# Patient Record
Sex: Male | Born: 1950 | Race: White | Hispanic: Yes | Marital: Married | Smoking: Never smoker
Health system: Southern US, Community
[De-identification: ages and names within clinical notes are randomized; demographics above are authoritative.]

## PROBLEM LIST (undated history)

## (undated) DIAGNOSIS — E7800 Pure hypercholesterolemia, unspecified: Secondary | ICD-10-CM

## (undated) DIAGNOSIS — I10 Essential (primary) hypertension: Secondary | ICD-10-CM

## (undated) HISTORY — PX: NASAL SEPTUM SURGERY: SHX37

## (undated) HISTORY — PX: KNEE SURGERY: SHX244

## (undated) HISTORY — PX: TONSILLECTOMY: SUR1361

---

## 2016-08-06 ENCOUNTER — Encounter (HOSPITAL_BASED_OUTPATIENT_CLINIC_OR_DEPARTMENT_OTHER): Payer: Self-pay | Admitting: *Deleted

## 2016-08-06 ENCOUNTER — Emergency Department (HOSPITAL_BASED_OUTPATIENT_CLINIC_OR_DEPARTMENT_OTHER): Payer: Medicare HMO

## 2016-08-06 ENCOUNTER — Observation Stay (HOSPITAL_BASED_OUTPATIENT_CLINIC_OR_DEPARTMENT_OTHER)
Admission: EM | Admit: 2016-08-06 | Discharge: 2016-08-07 | Disposition: A | Discharge status: Home / Self Care | Payer: Medicare HMO | Attending: Internal Medicine | Admitting: Internal Medicine

## 2016-08-06 DIAGNOSIS — R5383 Other fatigue: Secondary | ICD-10-CM | POA: Diagnosis not present

## 2016-08-06 DIAGNOSIS — M542 Cervicalgia: Secondary | ICD-10-CM

## 2016-08-06 DIAGNOSIS — R0789 Other chest pain: Secondary | ICD-10-CM | POA: Diagnosis present

## 2016-08-06 DIAGNOSIS — K219 Gastro-esophageal reflux disease without esophagitis: Secondary | ICD-10-CM | POA: Diagnosis present

## 2016-08-06 DIAGNOSIS — I10 Essential (primary) hypertension: Secondary | ICD-10-CM | POA: Diagnosis not present

## 2016-08-06 DIAGNOSIS — E785 Hyperlipidemia, unspecified: Secondary | ICD-10-CM | POA: Diagnosis not present

## 2016-08-06 DIAGNOSIS — I4510 Unspecified right bundle-branch block: Secondary | ICD-10-CM | POA: Insufficient documentation

## 2016-08-06 DIAGNOSIS — G8929 Other chronic pain: Secondary | ICD-10-CM | POA: Diagnosis present

## 2016-08-06 DIAGNOSIS — R079 Chest pain, unspecified: Secondary | ICD-10-CM | POA: Diagnosis not present

## 2016-08-06 HISTORY — DX: Pure hypercholesterolemia, unspecified: E78.00

## 2016-08-06 HISTORY — DX: Essential (primary) hypertension: I10

## 2016-08-06 LAB — CBC
HEMATOCRIT: 45.2 % (ref 39.0–52.0)
Hemoglobin: 14.7 g/dL (ref 13.0–17.0)
MCH: 30.2 pg (ref 26.0–34.0)
MCHC: 32.5 g/dL (ref 30.0–36.0)
MCV: 93 fL (ref 78.0–100.0)
Platelets: 217 10*3/uL (ref 150–400)
RBC: 4.86 MIL/uL (ref 4.22–5.81)
RDW: 13.5 % (ref 11.5–15.5)
WBC: 9.2 10*3/uL (ref 4.0–10.5)

## 2016-08-06 LAB — BASIC METABOLIC PANEL
Anion gap: 7 (ref 5–15)
BUN: 20 mg/dL (ref 6–20)
CO2: 29 mmol/L (ref 22–32)
Calcium: 9.4 mg/dL (ref 8.9–10.3)
Chloride: 101 mmol/L (ref 101–111)
Creatinine, Ser: 0.98 mg/dL (ref 0.61–1.24)
GFR calc non Af Amer: >60 mL/min (ref >60)
GLUCOSE: 140 mg/dL — AB (ref 65–99)
POTASSIUM: 4.1 mmol/L (ref 3.5–5.1)
Sodium: 137 mmol/L (ref 135–145)

## 2016-08-06 LAB — TROPONIN I: Troponin I: <0.03 ng/mL (ref <0.03)

## 2016-08-06 LAB — D-DIMER, QUANTITATIVE (NOT AT ARMC): D DIMER QUANT: 0.32 ug{FEU}/mL (ref 0.00–0.50)

## 2016-08-06 MED ORDER — ASPIRIN 81 MG PO CHEW
81.0000 mg | CHEWABLE_TABLET | Freq: Every day | ORAL | Status: DC
  Administered 2016-08-07: 81 mg via ORAL
  Filled 2016-08-06: qty 1

## 2016-08-06 MED ORDER — HYDROCODONE-ACETAMINOPHEN 7.5-325 MG PO TABS
1.0000 | ORAL_TABLET | Freq: Four times a day (QID) | ORAL | Status: DC | PRN
  Administered 2016-08-06 – 2016-08-07 (×2): 1 via ORAL
  Filled 2016-08-06 (×2): qty 1

## 2016-08-06 MED ORDER — ACETAMINOPHEN 325 MG PO TABS: 650.0000 mg | ORAL_TABLET | ORAL | Status: DC | PRN

## 2016-08-06 MED ORDER — NITROGLYCERIN 2 % TD OINT
1.0000 [in_us] | TOPICAL_OINTMENT | Freq: Once | TRANSDERMAL | Status: AC
  Administered 2016-08-06: 1 [in_us] via TOPICAL
  Filled 2016-08-06: qty 1

## 2016-08-06 MED ORDER — MORPHINE SULFATE (PF) 2 MG/ML IV SOLN: 2.0000 mg | INTRAVENOUS | Status: DC | PRN

## 2016-08-06 MED ORDER — ASPIRIN 81 MG PO CHEW
324.0000 mg | CHEWABLE_TABLET | Freq: Once | ORAL | Status: DC
Start: 2016-08-06 — End: 2016-08-06

## 2016-08-06 MED ORDER — ONDANSETRON HCL 4 MG/2ML IJ SOLN: 4.0000 mg | Freq: Four times a day (QID) | INTRAMUSCULAR | Status: DC | PRN

## 2016-08-06 MED ORDER — ALPRAZOLAM 0.25 MG PO TABS
0.2500 mg | ORAL_TABLET | Freq: Two times a day (BID) | ORAL | Status: DC | PRN
Start: 2016-08-06 — End: 2016-08-07

## 2016-08-06 MED ORDER — ENOXAPARIN SODIUM 40 MG/0.4ML ~~LOC~~ SOLN
40.0000 mg | Freq: Every day | SUBCUTANEOUS | Status: DC
  Administered 2016-08-06: 40 mg via SUBCUTANEOUS
  Filled 2016-08-06: qty 0.4

## 2016-08-06 MED ORDER — INSULIN ASPART 100 UNIT/ML ~~LOC~~ SOLN
0.0000 [IU] | SUBCUTANEOUS | Status: DC
  Administered 2016-08-07: 1 [IU] via SUBCUTANEOUS
  Administered 2016-08-07: 2 [IU] via SUBCUTANEOUS

## 2016-08-06 MED ORDER — LOSARTAN POTASSIUM 50 MG PO TABS
100.0000 mg | ORAL_TABLET | Freq: Every day | ORAL | Status: DC
Start: 2016-08-07 — End: 2016-08-07
  Administered 2016-08-07: 100 mg via ORAL
  Filled 2016-08-06: qty 2

## 2016-08-06 NOTE — ED Notes (Addendum)
ED Provider at bedside. Updated by RN that pt pain has increased.

## 2016-08-06 NOTE — Plan of Care (Signed)
Problem: Safety: Goal: Ability to remain free from injury will improve Outcome: Progressing Reviewed safety instructions on unit and reviewed how to use call light and what the white board is, showed RN name and phone number along with NT, showed a demonstration and patietn verbalized understanding, will continue to monitor

## 2016-08-06 NOTE — ED Notes (Signed)
ED Provider at bedside. 

## 2016-08-06 NOTE — ED Provider Notes (Addendum)
[facility] Provider Note   CSN: [id number] Arrival date & time: 08/06/16  1526  By signing my name below, I, Modena Jansky, attest that this documentation has been prepared under the direction and in the presence of Nira Conn, MD. Electronically Signed: Modena Jansky, Scribe. 08/06/2016. 3:44 PM.  History   Chief Complaint Chief Complaint  Patient presents with  . Chest Pain   The history is provided by the patient. No language interpreter was used.   HPI Comments: Daniel Parker is a 66 y.o. male who presents to the Emergency Department complaining of intermittent moderate chest pain that started last night. He was recently treated with prednisone and antibiotic 3 weeks ago and finished both courses a week ago. He states his pain was he was evaluated for acid reflux and had an abnormal EKG today, so he was sent to the ED for further evaluation. No prior EKG. His pain was relieved temporarily with apple vinegar mixed with water. His pain as exacerbated by deep breathing and laying flat.  He describes the pain as a constant "discomfort" feeling since this morning. He reports associated fatigue. He has a prior hx of normal stress tests. He has a prior hx of similar pain (this episode was more persistent), tachycardia, hyperlipidemia, and HTN. He denies any prior hx of MI, recent surgeries/long trips, current use of hormone treatment, cough, SOB, jaw/extremity pain, leg swelling, or other compaints.   Past Medical History:  Diagnosis Date  . High cholesterol   . Hypertension     Patient Active Problem List   Diagnosis Date Noted  . Chest pain 08/06/2016    Past Surgical History:  Procedure Laterality Date  . KNEE SURGERY    . NASAL SEPTUM SURGERY    . TONSILLECTOMY         Home Medications    Prior to Admission medications   Medication Sig Start Date End Date Taking? Authorizing Provider  amLODipine (NORVASC) 5 MG tablet Take 5 mg by mouth daily.    Yes Historical Provider, MD  Atorvastatin Calcium (LIPITOR PO) Take 40 mg by mouth.    Yes Historical Provider, MD  HYDROcodone-acetaminophen (NORCO) 7.5-325 MG tablet Take 1 tablet by mouth every 6 (six) hours as needed for moderate pain.   Yes Historical Provider, MD  LOSARTAN POTASSIUM PO Take 100 mg by mouth.    Yes Historical Provider, MD    Family History No family history on file.  Social History Social History  Substance Use Topics  . Smoking status: Never Smoker  . Smokeless tobacco: Never Used  . Alcohol use No     Allergies   Patient has no known allergies.   Review of Systems Review of Systems  A complete 10 system review of systems was obtained and all systems are negative except as noted in the HPI and PMH.    Physical Exam Updated Vital Signs BP 166/84 (BP Location: Right Arm)   Pulse 104   Temp 98.3 F (36.8 C) (Oral)   Resp (!) 106   Ht 5' 11.5" (1.816 m)   Wt 230 lb (104.3 kg)   SpO2 98%   BMI 31.63 kg/m   Physical Exam  Constitutional: He is oriented to person, place, and time. He appears well-developed and well-nourished. No distress.  HENT:  Head: Normocephalic and atraumatic.  Nose: Nose normal.  Eyes: Conjunctivae and EOM are normal. Pupils are equal, round, and reactive to light. Right eye exhibits no discharge. Left eye exhibits no  discharge. No scleral icterus.  Neck: Normal range of motion. Neck supple.  Cardiovascular: Regular rhythm.  Tachycardia present.  Exam reveals no gallop and no friction rub.   No murmur heard. Pulmonary/Chest: Effort normal and breath sounds normal. No stridor. No respiratory distress. He has no rales.  Abdominal: Soft. He exhibits no distension. There is no tenderness.  Musculoskeletal: He exhibits no edema or tenderness.  Neurological: He is alert and oriented to person, place, and time.  Skin: Skin is warm and dry. No rash noted. He is not diaphoretic. No erythema.  Psychiatric: He has a normal mood and  affect.  Vitals reviewed.    ED Treatments / Results  DIAGNOSTIC STUDIES: Oxygen Saturation is 98% on RA, normal by my interpretation.    COORDINATION OF CARE: 3:48 PM- Pt advised of plan for treatment and pt agrees.  Labs (all labs ordered are listed, but only abnormal results are displayed) Labs Reviewed  BASIC METABOLIC PANEL - Abnormal; Notable for the following:       Result Value   Glucose, Bld 140 (*)    All other components within normal limits  CBC  TROPONIN I  D-DIMER, QUANTITATIVE (NOT AT [facility])  TROPONIN I    EKG  EKG Interpretation  Date/Time:  Monday August 06 2016 15:33:12 EDT Ventricular Rate:  100 PR Interval:  162 QRS Duration: 138 QT Interval:  382 QTC Calculation: 492 R Axis:   -37 Text Interpretation:  Normal sinus rhythm Left axis deviation Right bundle branch block Moderate voltage criteria for LVH, may be normal variant Abnormal ECG No stemi No old tracing to compare Confirmed by [facility] MD, Chenoah Mcnally [phone]) on 08/06/2016 3:43:47 PM       Radiology Dg Chest 2 View  Result Date: 08/06/2016 CLINICAL DATA:  Chest pain. EXAM: CHEST  2 VIEW COMPARISON:  No prior. FINDINGS: Mediastinum hilar structures are normal. Mild bibasilar subsegmental atelectasis. No focal alveolar infiltrate. No pleural effusion or pneumothorax. Thoracic spine scoliosis. IMPRESSION: Mild bibasilar subsegmental atelectasis. No focal alveolar infiltrate. Electronically Signed   By: Maisie Fus  Register   On: 08/06/2016 16:21    Procedures Procedures (including critical care time)  Medications Ordered in ED Medications  nitroGLYCERIN (NITROGLYN) 2 % ointment 1 inch (1 inch Topical Given 08/06/16 1621)     Initial Impression / Assessment and Plan / ED Course  I have reviewed the triage vital signs and the nursing notes.  Pertinent labs & imaging results that were available during my care of the patient were reviewed by me and considered in my medical decision making (see chart for  details).     Nonspecific chest pain, EKG with right bundle branch block without evidence of acute ischemia or pericarditis. Unable to compare to previous however patient reports that his primary care doctor previously mentioned that is EKGs were "normal." Chest x-ray without evidence suggestive of pneumonia, pneumothorax, pneumomediastinum.  No abnormal contour of the mediastinum to suggest dissection. No evidence of acute injuries. Initial trop negative.   Significant improvement with NTG gel.  HEART score >4. Will require admission for ACS rule out.  D-dimer negative. Doubt pulmonary embolism. Presentation is not classic for aortic dissection or esophageal perforation.  Admitted to [facility] under hospitalist care for ACS rule out.   Final Clinical Impressions(s) / ED Diagnoses   Final diagnoses:  Nonspecific chest pain     I personally performed the services described in this documentation, which was scribed in my presence. The recorded information has been reviewed  and is accurate.        Nira ConnPedro Eduardo Taquita Demby, MD 08/06/16 1944

## 2016-08-06 NOTE — ED Triage Notes (Signed)
He went to his MD today for reflux. EKG was abnormal and he was sent here for evaluation.

## 2016-08-06 NOTE — ED Notes (Signed)
IV attempted x2 without success.

## 2016-08-06 NOTE — Progress Notes (Signed)
66 yo M with HTN, hyperlipidemia, +FHx for CAD presents with intermittent chest pain.  BP 160/83   Pulse 93   Temp 98.3 F (36.8 C) (Oral)   Resp 16   Ht 5' 11.5" (1.816 m)   Wt 104.3 kg (230 lb)   SpO2 92%   BMI 31.63 kg/m   Initial trop negative D-dimer negative ECG without prior shows RBBB (his PCP, told him his previous ECG was normal)  HEART score 4 To tele, OBS status for cycling enzymes.

## 2016-08-06 NOTE — ED Notes (Signed)
Patient transported to X-ray 

## 2016-08-06 NOTE — H&P (Signed)
Daniel Parker:[id number] DOB: May 15, 1951 DOA: 08/06/2016     PCP: Daniel Balls, MD  [facility]: [facility]  In the past.  Patient coming from: home Lives  With family   Chief Complaint: Chest Pain  HPI: Daniel Parker is a 66 y.o. male with medical history significant of GERD, HTN, HLD prediabetes     Presented with  4 AM last nigh had GERD like chest pain not better after drinking vinegar lasted 5 min, had an other episode when he got up it was minimal discomfort. and wife was worried. He presented to Clinic and was found to have abnormal ECG and sent him to ER. Presented to [facility]. no other symptoms, non radiating.  Reports some naggin sensation in his chest better after Nitro path, feels like soreness. No nausea no diaphoresis Had URI last week and was treated with antibiotics and streroids.   Regarding pertinent Chronic problems:  Had Echo stress test 5 years ago and was negative.  HAs hx of HLD states under good control.  IN ER:  Temp (24hrs), Avg:98.5 F (36.9 C), Min:98.3 F (36.8 C), Max:98.7 F (37.1 C)   RR 21 94% BP 131/75 Trop negative x3 Cr 0.98 WBC 9.2 CXR non acute Following Medications were ordered in ER: Medications  nitroGLYCERIN (NITROGLYN) 2 % ointment 1 inch (1 inch Topical Given 08/06/16 1621)       Hospitalist was called for admission for Atypical chest pain evaluation  Review of Systems:    Pertinent positives include: chest pain  Constitutional:  No weight loss, night sweats, Fevers, chills, fatigue, weight loss  HEENT:  No headaches, Difficulty swallowing,Tooth/dental problems,Sore throat,  No sneezing, itching, ear ache, nasal congestion, post nasal drip,  Cardio-vascular:  No , Orthopnea, PND, anasarca, dizziness, palpitations.no Bilateral lower extremity swelling  GI:  No heartburn, indigestion, abdominal pain, nausea, vomiting, diarrhea, change in bowel habits, loss of appetite, melena,  blood in stool, hematemesis Resp:  no shortness of breath at rest. No dyspnea on exertion, No excess mucus, no productive cough, No non-productive cough, No coughing up of blood.No change in color of mucus.No wheezing. Skin:  no rash or lesions. No jaundice GU:  no dysuria, change in color of urine, no urgency or frequency. No straining to urinate.  No flank pain.  Musculoskeletal:  No joint pain or no joint swelling. No decreased range of motion. No back pain.  Psych:  No change in mood or affect. No depression or anxiety. No memory loss.  Neuro: no localizing neurological complaints, no tingling, no weakness, no double vision, no gait abnormality, no slurred speech, no confusion  As per HPI otherwise 10 point review of systems negative.   Past Medical History: Past Medical History:  Diagnosis Date  . High cholesterol   . Hypertension    Past Surgical History:  Procedure Laterality Date  . KNEE SURGERY    . NASAL SEPTUM SURGERY    . TONSILLECTOMY       Social History:  Ambulatory  independently     reports that he has never smoked. He has never used smokeless tobacco. He reports that he does not drink alcohol. His drug history is not on file.  Allergies:  No Known Allergies     Family History:   Family History  Problem Relation Age of Onset  . CAD Mother   . Diabetes Mother   . Hypertension Mother   . CAD Father   . Diabetes Father   .  Prostate cancer Father   . Diabetes Other   . Breast cancer Other     Medications: Prior to Admission medications   Medication Sig Start Date End Date Taking? Authorizing Provider  amLODipine (NORVASC) 5 MG tablet Take 10 mg by mouth daily.    Yes Historical Provider, MD  Atorvastatin Calcium (LIPITOR PO) Take 40 mg by mouth.    Yes Historical Provider, MD  HYDROcodone-acetaminophen (NORCO) 7.5-325 MG tablet Take 1 tablet by mouth every 6 (six) hours as needed for moderate pain.   Yes Historical Provider, MD  LOSARTAN  POTASSIUM PO Take 100 mg by mouth.    Yes Historical Provider, MD    Physical Exam: Patient Vitals for the past 24 hrs:  BP Temp Temp src Pulse Resp SpO2 Height Weight  08/06/16 2145 (!) 147/78 98.4 F (36.9 C) Oral 98 - 95 % 5' 11.5" (1.816 m) 103.3 kg (227 lb 12.8 oz)  08/06/16 2053 - 98.7 F (37.1 C) Oral - - - - -  08/06/16 2030 131/75 - - 95 21 94 % - -  08/06/16 2000 135/78 - - 96 14 94 % - -  08/06/16 1930 160/83 - - 93 16 92 % - -  08/06/16 1800 122/65 - - 92 12 95 % - -  08/06/16 1730 130/85 - - 93 18 92 % - -  08/06/16 1700 144/85 - - 99 23 94 % - -  08/06/16 1623 125/98 - - 96 18 97 % - -  08/06/16 1540 166/84 98.3 F (36.8 C) Oral 104 18 98 % - -  08/06/16 1529 - - - - - - 5' 11.5" (1.816 m) 104.3 kg (230 lb)    1. General:  in No Acute distress 2. Psychological: Alert and Oriented 3. Head/ENT:   Moist  Mucous Membranes                          Head Non traumatic, neck supple                          Normal  Dentition 4. SKIN: normal  Skin turgor,  Skin clean Dry and intact no rash 5. Heart: Regular rate and rhythm no  Murmur, Rub or gallop 6. Lungs: Clear to auscultation bilaterally, no wheezes or crackles   7. Abdomen: Soft,  non-tender, Non distended 8. Lower extremities: no clubbing, cyanosis, or edema 9. Neurologically Grossly intact, moving all 4 extremities equally  10. MSK: Normal range of motion   body mass index is 31.33 kg/m.  Labs on Admission:   Labs on Admission: I have personally reviewed following labs and imaging studies  CBC:  Recent Labs Lab 08/06/16 1553  WBC 9.2  HGB 14.7  HCT 45.2  MCV 93.0  PLT 217   Basic Metabolic Panel:  Recent Labs Lab 08/06/16 1553  NA 137  K 4.1  CL 101  CO2 29  GLUCOSE 140*  BUN 20  CREATININE 0.98  CALCIUM 9.4   GFR: Estimated Creatinine Clearance: 90.2 mL/min (by C-G formula based on SCr of 0.98 mg/dL). Liver Function Tests: No results for input(s): AST, ALT, ALKPHOS, BILITOT, PROT,  ALBUMIN in the last 168 hours. No results for input(s): LIPASE, AMYLASE in the last 168 hours. No results for input(s): AMMONIA in the last 168 hours. Coagulation Profile: No results for input(s): INR, PROTIME in the last 168 hours. Cardiac Enzymes:  Recent Labs Lab 08/06/16  1558 08/06/16 1801  TROPONINI <0.03 <0.03   BNP (last 3 results) No results for input(s): PROBNP in the last 8760 hours. HbA1C: No results for input(s): HGBA1C in the last 72 hours. CBG: No results for input(s): GLUCAP in the last 168 hours. Lipid Profile: No results for input(s): CHOL, HDL, LDLCALC, TRIG, CHOLHDL, LDLDIRECT in the last 72 hours. Thyroid Function Tests: No results for input(s): TSH, T4TOTAL, FREET4, T3FREE, THYROIDAB in the last 72 hours. Anemia Panel: No results for input(s): VITAMINB12, FOLATE, FERRITIN, TIBC, IRON, RETICCTPCT in the last 72 hours. Urine analysis: No results found for: COLORURINE, APPEARANCEUR, LABSPEC, PHURINE, GLUCOSEU, HGBUR, BILIRUBINUR, KETONESUR, PROTEINUR, UROBILINOGEN, NITRITE, LEUKOCYTESUR Sepsis Labs: @LABRCNTIP (procalcitonin:4,lacticidven:4) )No results found for this or any previous visit (from the past 240 hour(s)).    UA  not ordered  No results found for: HGBA1C  Estimated Creatinine Clearance: 90.2 mL/min (by C-G formula based on SCr of 0.98 mg/dL).  BNP (last 3 results) No results for input(s): PROBNP in the last 8760 hours.   ECG REPORT  Independently reviewed Rate: 89  Rhythm: SR RBBB left ventricular hypertrophy ST&T Change: No acute ischemic changes   QTC 454  Filed Weights   08/06/16 1529 08/06/16 2145  Weight: 104.3 kg (230 lb) 103.3 kg (227 lb 12.8 oz)     Cultures: No results found for: SDES, SPECREQUEST, CULT, REPTSTATUS   Radiological Exams on Admission: Dg Chest 2 View  Result Date: 08/06/2016 CLINICAL DATA:  Chest pain. EXAM: CHEST  2 VIEW COMPARISON:  No prior. FINDINGS: Mediastinum hilar structures are normal. Mild  bibasilar subsegmental atelectasis. No focal alveolar infiltrate. No pleural effusion or pneumothorax. Thoracic spine scoliosis. IMPRESSION: Mild bibasilar subsegmental atelectasis. No focal alveolar infiltrate. Electronically Signed   By: Maisie Fushomas  Register   On: 08/06/2016 16:21    Chart has been reviewed    Assessment/Plan  66 y.o. male with medical history significant of GERD, HTN, HLD prediabetes admitted for atypical chest pain troponin negative 3    Present on Admission: . Chest pain - - given risk factors will admit, monitor on telemetry, cycle cardiac enzymes, obtain serial ECG.ECHO in AM Further risk stratify with lipid panel, hgA1C, obtain TSH. Make sure patient is on Aspirin. Further treatment based on the currently pending results.  NPO after breakfast for possible stress test  . Essential hypertension - chronic stable, continue to monitor, continue home medications . GERD (gastroesophageal reflux disease) possible source of discomfort is no cardiac etiology noted . Chronic neck pain - stable continue home medicaitons . HLD (hyperlipidemia) stable will continue home medications   Other plan as per orders.  DVT prophylaxis:   Lovenox     Code Status: Limited code  as per patient   Family Communication:   Family not at  Bedside   Disposition Plan:   To home once workup is complete and patient is stable     Consults called: email cardiology   Admission status:   obs   Level of care    tele         I have spent a total of 56 min on this admission       Meelah Tallo 08/06/2016, 11:57 PM    [facility]  Pager [phone]   after 2 AM please page floor coverage PA If 7AM-7PM, please contact the day team taking care of the patient  [email or web address]  Password [email or web address]

## 2016-08-07 ENCOUNTER — Observation Stay (HOSPITAL_COMMUNITY): Payer: Medicare HMO

## 2016-08-07 DIAGNOSIS — I10 Essential (primary) hypertension: Secondary | ICD-10-CM

## 2016-08-07 DIAGNOSIS — K219 Gastro-esophageal reflux disease without esophagitis: Secondary | ICD-10-CM

## 2016-08-07 DIAGNOSIS — R079 Chest pain, unspecified: Secondary | ICD-10-CM | POA: Diagnosis not present

## 2016-08-07 LAB — GLUCOSE, CAPILLARY
GLUCOSE-CAPILLARY: 121 mg/dL — AB (ref 65–99)
GLUCOSE-CAPILLARY: 186 mg/dL — AB (ref 65–99)
Glucose-Capillary: 119 mg/dL — ABNORMAL HIGH (ref 65–99)
Glucose-Capillary: 128 mg/dL — ABNORMAL HIGH (ref 65–99)

## 2016-08-07 LAB — LIPID PANEL
CHOLESTEROL: 181 mg/dL (ref 0–200)
HDL: 44 mg/dL (ref >40)
LDL Cholesterol: 98 mg/dL (ref 0–99)
TRIGLYCERIDES: 193 mg/dL — AB (ref <150)
Total CHOL/HDL Ratio: 4.1 RATIO
VLDL: 39 mg/dL (ref 0–40)

## 2016-08-07 LAB — TROPONIN I
Troponin I: <0.03 ng/mL (ref <0.03)
Troponin I: <0.03 ng/mL (ref <0.03)
Troponin I: <0.03 ng/mL (ref <0.03)

## 2016-08-07 LAB — TSH: TSH: 1.699 u[IU]/mL (ref 0.350–4.500)

## 2016-08-07 MED ORDER — PANTOPRAZOLE SODIUM 40 MG PO TBEC
40.0000 mg | DELAYED_RELEASE_TABLET | Freq: Every day | ORAL | Status: DC
  Administered 2016-08-07: 40 mg via ORAL
  Filled 2016-08-07: qty 1

## 2016-08-07 NOTE — Care Management Note (Signed)
Case Management Note  Patient Details  Name: Milas GainRichard Dewolfe MRN: [id number] Date of Birth: Dec 20, 1950  Subjective/Objective: Pt presented for chest pain. Pt is from home with wife. PTA independent.                    Action/Plan: No needs identified by CM at this time.   Expected Discharge Date:  08/07/16               Expected Discharge Plan:  Home/Self Care  In-House Referral:  NA  Discharge planning Services  CM Consult  Post Acute Care Choice:  NA Choice offered to:  NA  DME Arranged:  N/A DME Agency:  NA  HH Arranged:  NA HH Agency:  NA  Status of Service:  Completed, signed off  If discussed at Long Length of Stay Meetings, dates discussed:    Additional Comments:  Gala LewandowskyGraves-Bigelow, Summers Buendia Kaye, RN 08/07/2016, 11:39 AM

## 2016-08-07 NOTE — Care Management Obs Status (Signed)
MEDICARE OBSERVATION STATUS NOTIFICATION   Patient Details  Name: Daniel GainRichard Krabill MRN: [id number] Date of Birth: 08-06-1950   Medicare Observation Status Notification Given:  Yes    Gala LewandowskyGraves-Bigelow, Gracious Renken Kaye, RN 08/07/2016, 11:38 AM

## 2016-08-07 NOTE — Discharge Summary (Signed)
Physician Discharge Summary  Daniel GainRichard Tremaine UEA:[id number] DOB: [birth year] DOA: 08/06/2016  PCP: Almedia BallsKELLY,SAM, MD  Admit date: 08/06/2016 Discharge date: 08/07/2016   Recommendations for Outpatient Follow-Up:   1. outpatient cardiology follow up 2. Continue PPI 3. outpatient GI referral   Discharge Diagnosis:   Active Problems:   Chest pain   Essential hypertension   GERD (gastroesophageal reflux disease)   Chronic neck pain   HLD (hyperlipidemia)   Discharge disposition:  Home. :  Discharge Condition: Improved.  Diet recommendation: Low sodium, heart healthy.  Wound care: None.   History of Present Illness:   Daniel Parker is a 66 y.o. male who presents to the Emergency Department complaining of intermittent moderate chest pain that started last night. He was recently treated with prednisone and antibiotic 3 weeks ago and finished both courses a week ago. He states his pain was he was evaluated for acid reflux and had an abnormal EKG today, so he was sent to the ED for further evaluation. No prior EKG. His pain was relieved temporarily with apple vinegar mixed with water. His pain as exacerbated by deep breathing and laying flat.  He describes the pain as a constant "discomfort" feeling since this morning. He reports associated fatigue. He has a prior hx of normal stress tests. He has a prior hx of similar pain (this episode was more persistent), tachycardia, hyperlipidemia, and HTN. He denies any prior hx of MI, recent surgeries/long trips, current use of hormone treatment, cough, SOB, jaw/extremity pain, leg swelling, or other compaints.    Hospital Course by Problem:   Chest pain -spoke with cardiology-- can follow up outpatient-- patient and wife prefer to follow in [place] - CE negative TSH normal HgbA1C pending D dimer negative  GERD -has not been taking medications at home -resume GERD meds -may need outpatient GI follow up if not  controlled      Medical Consultants:    None.   Discharge Exam:   Vitals:   08/07/16 0500 08/07/16 0736  BP: 124/70 139/71  Pulse: (!) 110 93  Resp: 20 18  Temp: 98.3 F (36.8 C) 98.5 F (36.9 C)   Vitals:   08/06/16 2145 08/07/16 0058 08/07/16 0500 08/07/16 0736  BP: (!) 147/78 (!) 109/59 124/70 139/71  Pulse: 98 (!) 101 (!) 110 93  Resp:  18 20 18   Temp: 98.4 F (36.9 C) 98.6 F (37 C) 98.3 F (36.8 C) 98.5 F (36.9 C)  TempSrc: Oral Oral Oral Oral  SpO2: 95% 93% 95% 96%  Weight: 103.3 kg (227 lb 12.8 oz)     Height: 5' 11.5" (1.816 m)       Gen:  NAD- no further pain here   The results of significant diagnostics from this hospitalization (including imaging, microbiology, ancillary and laboratory) are listed below for reference.     Procedures and Diagnostic Studies:   Dg Chest 2 View  Result Date: 08/06/2016 CLINICAL DATA:  Chest pain. EXAM: CHEST  2 VIEW COMPARISON:  No prior. FINDINGS: Mediastinum hilar structures are normal. Mild bibasilar subsegmental atelectasis. No focal alveolar infiltrate. No pleural effusion or pneumothorax. Thoracic spine scoliosis. IMPRESSION: Mild bibasilar subsegmental atelectasis. No focal alveolar infiltrate. Electronically Signed   By: Maisie Fushomas  Register   On: 08/06/2016 16:21     Labs:   Basic Metabolic Panel:  Recent Labs Lab 08/06/16 1553  NA 137  K 4.1  CL 101  CO2 29  GLUCOSE 140*  BUN 20  CREATININE 0.98  CALCIUM  9.4   GFR Estimated Creatinine Clearance: 90.2 mL/min (by C-G formula based on SCr of 0.98 mg/dL). Liver Function Tests: No results for input(s): AST, ALT, ALKPHOS, BILITOT, PROT, ALBUMIN in the last 168 hours. No results for input(s): LIPASE, AMYLASE in the last 168 hours. No results for input(s): AMMONIA in the last 168 hours. Coagulation profile No results for input(s): INR, PROTIME in the last 168 hours.  CBC:  Recent Labs Lab 08/06/16 1553  WBC 9.2  HGB 14.7  HCT 45.2  MCV 93.0   PLT 217   Cardiac Enzymes:  Recent Labs Lab 08/06/16 1558 08/06/16 1801 08/06/16 2321 08/07/16 0150 08/07/16 0503  TROPONINI <0.03 <0.03 <0.03 <0.03 <0.03   BNP: Invalid input(s): POCBNP CBG:  Recent Labs Lab 08/07/16 0058 08/07/16 0504 08/07/16 0735  GLUCAP 186* 119* 128*   D-Dimer  Recent Labs  08/06/16 1558  DDIMER 0.32   Hgb A1c No results for input(s): HGBA1C in the last 72 hours. Lipid Profile  Recent Labs  08/07/16 0503  CHOL 181  HDL 44  LDLCALC 98  TRIG 193*  CHOLHDL 4.1   Thyroid function studies  Recent Labs  08/07/16 0503  TSH 1.699   Anemia work up No results for input(s): VITAMINB12, FOLATE, FERRITIN, TIBC, IRON, RETICCTPCT in the last 72 hours. Microbiology No results found for this or any previous visit (from the past 240 hour(s)).   Discharge Instructions:   Discharge Instructions    Diet - low sodium heart healthy    Complete by:  As directed    Discharge instructions    Complete by:  As directed    Outpatient cardiology follow up  Eventual outpatient GI follow up for GERD   Increase activity slowly    Complete by:  As directed      Allergies as of 08/07/2016   No Known Allergies     Medication List    TAKE these medications   amLODipine 10 MG tablet Commonly known as:  NORVASC Take 10 mg by mouth daily.   aspirin 81 MG chewable tablet Chew 81 mg by mouth daily.   CO Q 10 PO Take 1 capsule by mouth daily.   HYDROcodone-acetaminophen 7.5-325 MG tablet Commonly known as:  NORCO Take 1 tablet by mouth every 6 (six) hours as needed for moderate pain.   LIPITOR 40 MG tablet Generic drug:  atorvastatin Take 40 mg by mouth every evening.   losartan 100 MG tablet Commonly known as:  COZAAR Take 100 mg by mouth daily.   MULTIVITAMIN PO Take 1 tablet by mouth daily.   omeprazole 40 MG capsule Commonly known as:  PRILOSEC Take 40 mg by mouth daily.         Time coordinating discharge: 35  min  Signed:  Brianah Hopson U Daesia Zylka   [facility] 08/07/2016, 10:20 AM

## 2016-08-08 LAB — HEMOGLOBIN A1C
HEMOGLOBIN A1C: 6.9 % — AB (ref 4.8–5.6)
Mean Plasma Glucose: 151 mg/dL

## 2018-03-22 IMAGING — DX DG CHEST 2V
2 series · 2 of 2 positions shown · non-contrast
Comparison: No prior.

CLINICAL DATA: Chest pain.

EXAM:
CHEST  2 VIEW

[chest pa]
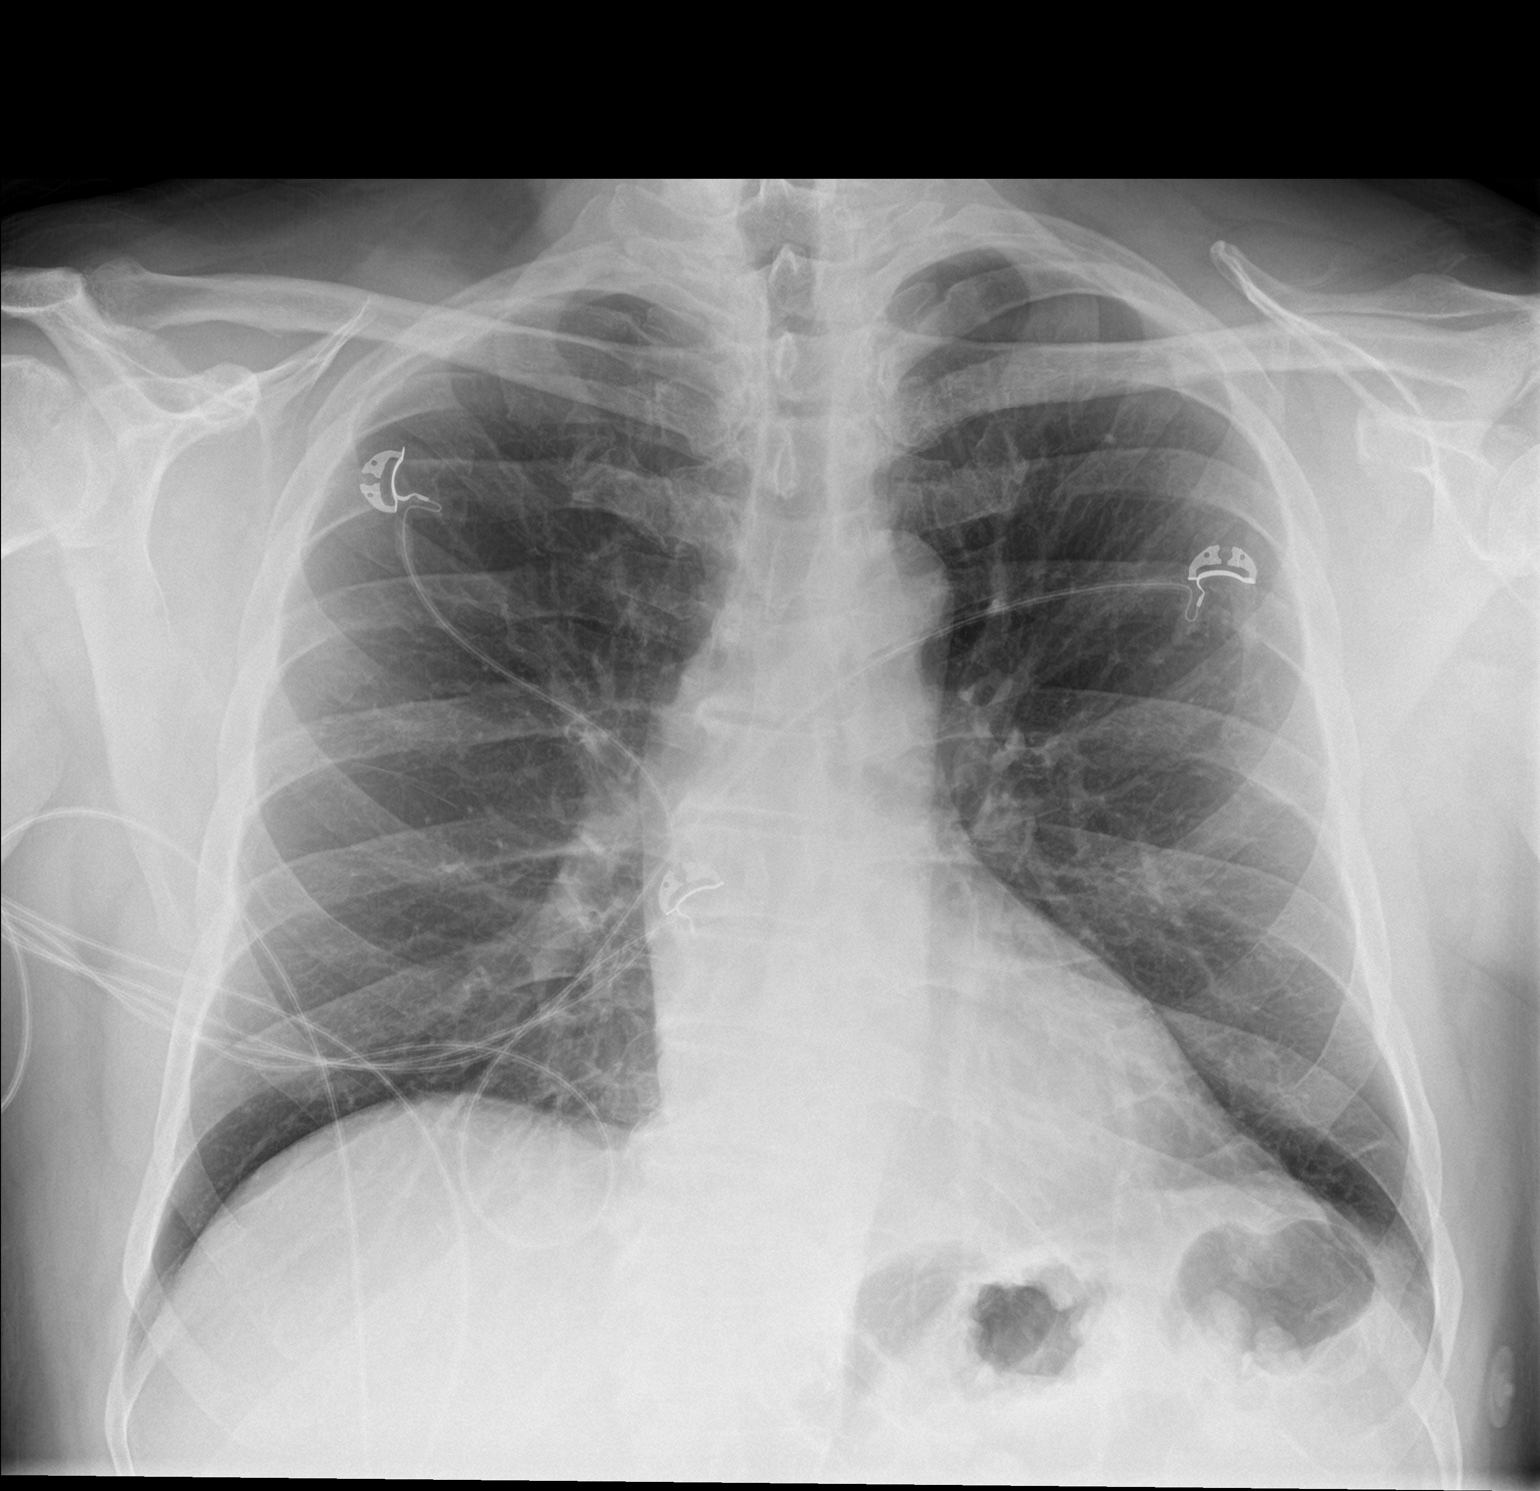

[chest lat]
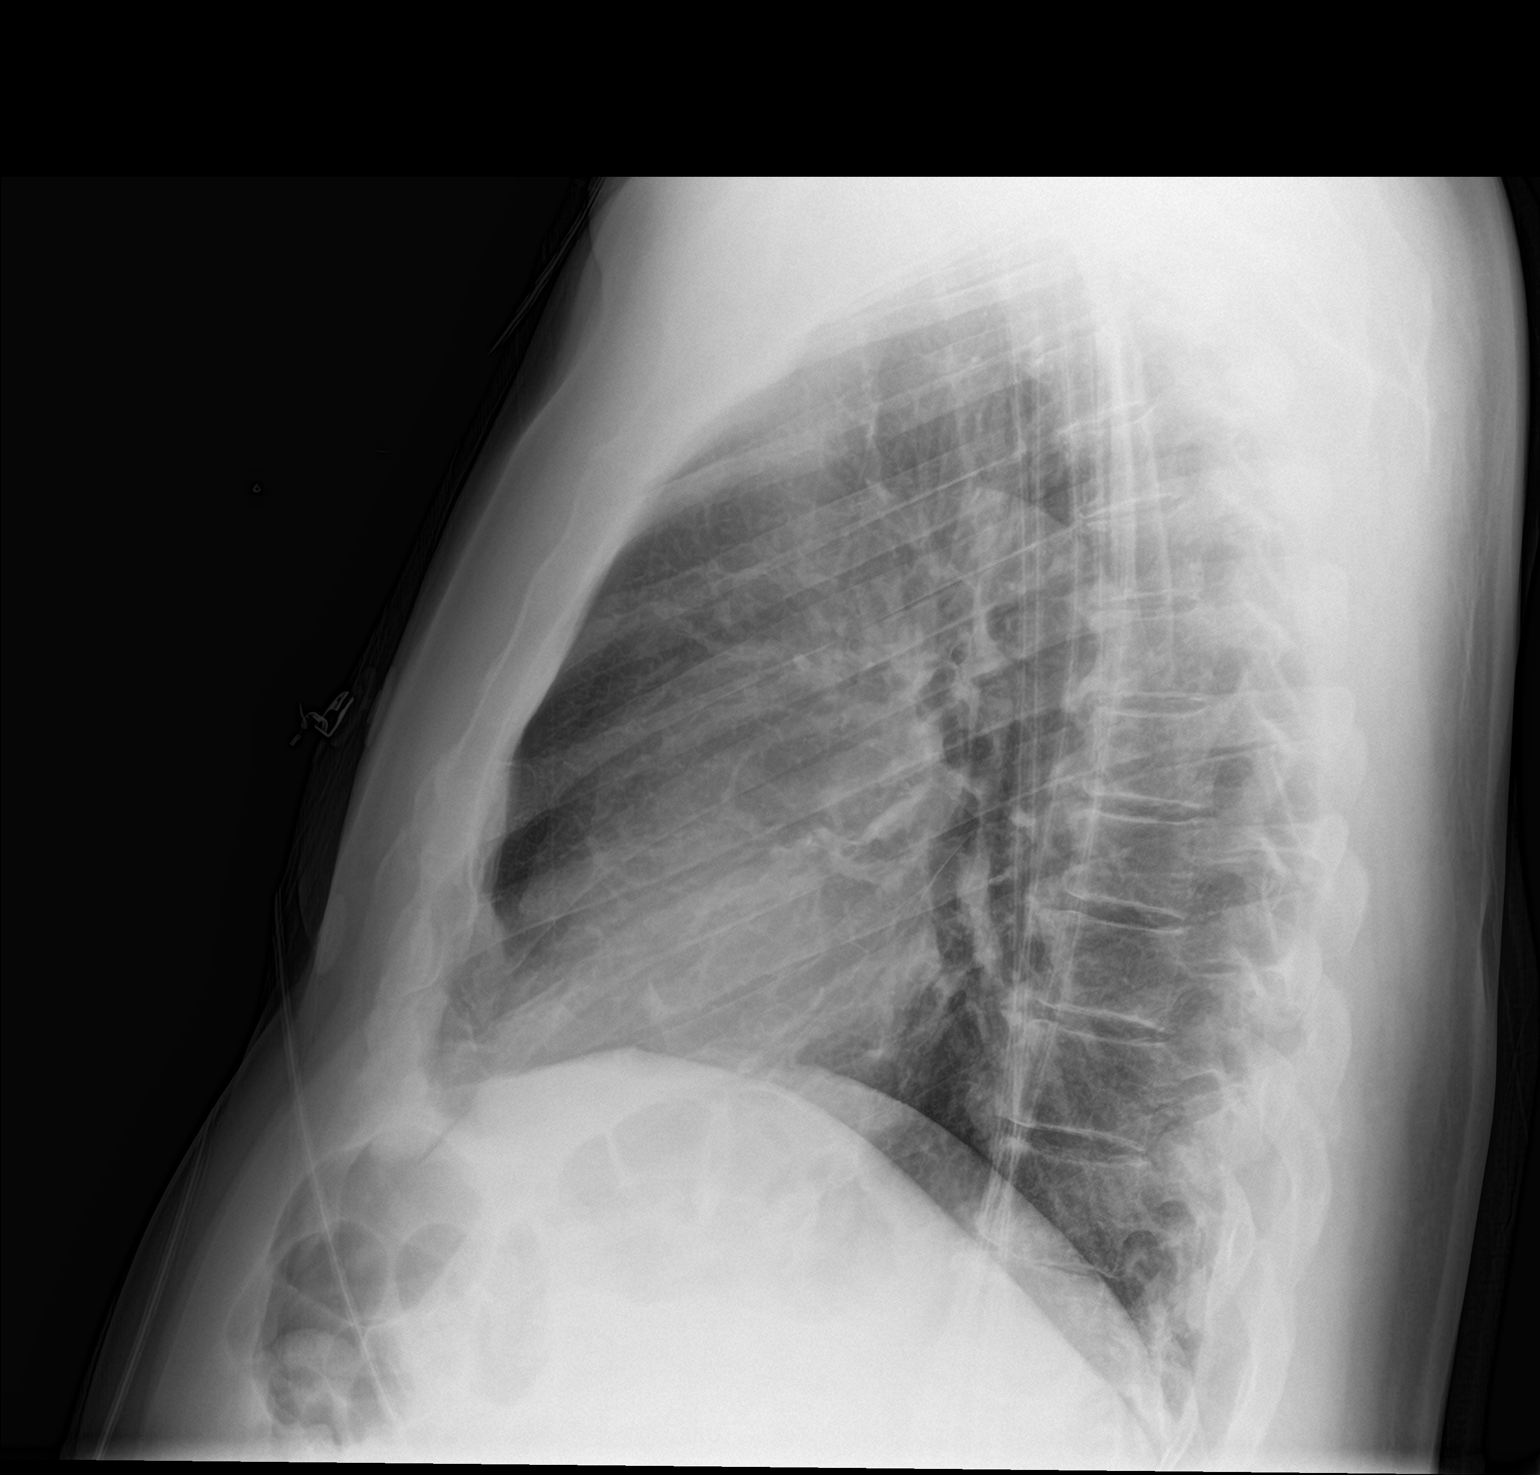

[2 of 2 positions shown; findings below may reference images not displayed]

FINDINGS: Mediastinum hilar structures are normal. Mild bibasilar subsegmental
atelectasis. No focal alveolar infiltrate. No pleural effusion or
pneumothorax. Thoracic spine scoliosis.
IMPRESSION: Mild bibasilar subsegmental atelectasis. No focal alveolar
infiltrate.
